# Patient Record
Sex: Male | Born: 1960 | Race: White | Hispanic: No | Marital: Married | Smoking: Never smoker
Health system: Southern US, Community
[De-identification: ages and names within clinical notes are randomized; demographics above are authoritative.]

## PROBLEM LIST (undated history)

## (undated) DIAGNOSIS — I10 Essential (primary) hypertension: Secondary | ICD-10-CM

## (undated) DIAGNOSIS — K219 Gastro-esophageal reflux disease without esophagitis: Secondary | ICD-10-CM

## (undated) DIAGNOSIS — R519 Headache, unspecified: Secondary | ICD-10-CM

## (undated) DIAGNOSIS — J302 Other seasonal allergic rhinitis: Secondary | ICD-10-CM

## (undated) DIAGNOSIS — E119 Type 2 diabetes mellitus without complications: Secondary | ICD-10-CM

## (undated) DIAGNOSIS — R51 Headache: Secondary | ICD-10-CM

## (undated) HISTORY — PX: CHOLECYSTECTOMY: SHX55

---

## 2001-11-06 ENCOUNTER — Encounter: Payer: Self-pay | Admitting: General Surgery

## 2001-11-06 ENCOUNTER — Ambulatory Visit (HOSPITAL_COMMUNITY): Admission: RE | Admit: 2001-11-06 | Discharge: 2001-11-07 | Payer: Self-pay | Admitting: General Surgery

## 2007-07-11 ENCOUNTER — Ambulatory Visit: Payer: Self-pay | Admitting: Cardiology

## 2011-12-22 ENCOUNTER — Encounter (INDEPENDENT_AMBULATORY_CARE_PROVIDER_SITE_OTHER): Payer: Self-pay | Admitting: *Deleted

## 2012-01-20 ENCOUNTER — Encounter (INDEPENDENT_AMBULATORY_CARE_PROVIDER_SITE_OTHER): Payer: Self-pay | Admitting: *Deleted

## 2012-01-20 ENCOUNTER — Other Ambulatory Visit (INDEPENDENT_AMBULATORY_CARE_PROVIDER_SITE_OTHER): Payer: Self-pay | Admitting: *Deleted

## 2012-01-20 ENCOUNTER — Telehealth (INDEPENDENT_AMBULATORY_CARE_PROVIDER_SITE_OTHER): Payer: Self-pay | Admitting: *Deleted

## 2012-01-20 DIAGNOSIS — Z1211 Encounter for screening for malignant neoplasm of colon: Secondary | ICD-10-CM

## 2012-01-20 MED ORDER — PEG-KCL-NACL-NASULF-NA ASC-C 100 G PO SOLR: 1.0000 | Freq: Once | ORAL | Status: DC

## 2012-01-20 NOTE — Telephone Encounter (Signed)
Patient needs movi prep 

## 2012-02-22 ENCOUNTER — Telehealth (INDEPENDENT_AMBULATORY_CARE_PROVIDER_SITE_OTHER): Payer: Self-pay | Admitting: *Deleted

## 2012-02-22 NOTE — Telephone Encounter (Signed)
PCP/Requesting MD: sasser  Name & DOB: Brett Ball [birth year]   Procedure: tcs  Reason/Indication:  screening  Has patient had this procedure before?  no  If so, when, by whom and where?    Is there a family history of colon cancer?  no  Who?  What age when diagnosed?    Is patient diabetic?   yes      Does patient have prosthetic heart valve?  no  Do you have a pacemaker?  no  Has patient had joint replacement within last 12 months?  no  Is patient on Coumadin, Plavix and/or Aspirin? no  Medications: metformin 500 mg daily (am), allegra 180 mg daily, metoprolol 50 mg daily, digestive enzyme prn, advil or tylenol prn  Allergies: nkda  Medication Adjustment: hold metformin morning of procecdure  Procedure date & time: 03/22/12 at 830

## 2012-02-24 NOTE — Telephone Encounter (Signed)
agree

## 2012-03-15 ENCOUNTER — Encounter (HOSPITAL_COMMUNITY): Payer: Self-pay | Admitting: Pharmacy Technician

## 2012-03-21 MED ORDER — SODIUM CHLORIDE 0.45 % IV SOLN
INTRAVENOUS | Status: DC
  Administered 2012-03-22: 08:00:00 via INTRAVENOUS

## 2012-03-22 ENCOUNTER — Encounter (HOSPITAL_COMMUNITY): Payer: Self-pay | Admitting: *Deleted

## 2012-03-22 ENCOUNTER — Encounter (HOSPITAL_COMMUNITY)
Admission: RE | Disposition: A | Discharge status: Home / Self Care | Payer: Self-pay | Source: Ambulatory Visit | Attending: Internal Medicine

## 2012-03-22 ENCOUNTER — Ambulatory Visit (HOSPITAL_COMMUNITY)
Admission: RE | Admit: 2012-03-22 | Discharge: 2012-03-22 | Disposition: A | Discharge status: Home / Self Care | Payer: BC Managed Care – PPO | Source: Ambulatory Visit | Attending: Internal Medicine | Admitting: Internal Medicine

## 2012-03-22 DIAGNOSIS — I10 Essential (primary) hypertension: Secondary | ICD-10-CM | POA: Insufficient documentation

## 2012-03-22 DIAGNOSIS — K644 Residual hemorrhoidal skin tags: Secondary | ICD-10-CM

## 2012-03-22 DIAGNOSIS — K219 Gastro-esophageal reflux disease without esophagitis: Secondary | ICD-10-CM | POA: Insufficient documentation

## 2012-03-22 DIAGNOSIS — E119 Type 2 diabetes mellitus without complications: Secondary | ICD-10-CM | POA: Insufficient documentation

## 2012-03-22 DIAGNOSIS — Z1211 Encounter for screening for malignant neoplasm of colon: Secondary | ICD-10-CM

## 2012-03-22 DIAGNOSIS — K5730 Diverticulosis of large intestine without perforation or abscess without bleeding: Secondary | ICD-10-CM | POA: Insufficient documentation

## 2012-03-22 HISTORY — DX: Gastro-esophageal reflux disease without esophagitis: K21.9

## 2012-03-22 HISTORY — DX: Type 2 diabetes mellitus without complications: E11.9

## 2012-03-22 HISTORY — PX: COLONOSCOPY: SHX5424

## 2012-03-22 HISTORY — DX: Other seasonal allergic rhinitis: J30.2

## 2012-03-22 HISTORY — DX: Essential (primary) hypertension: I10

## 2012-03-22 LAB — GLUCOSE, CAPILLARY: Glucose-Capillary: 120 mg/dL — ABNORMAL HIGH (ref 70–99)

## 2012-03-22 SURGERY — COLONOSCOPY
Anesthesia: Moderate Sedation

## 2012-03-22 MED ORDER — MEPERIDINE HCL 50 MG/ML IJ SOLN
INTRAMUSCULAR | Status: AC
  Filled 2012-03-22: qty 1

## 2012-03-22 MED ORDER — MIDAZOLAM HCL 5 MG/5ML IJ SOLN
INTRAMUSCULAR | Status: AC
  Filled 2012-03-22: qty 10

## 2012-03-22 MED ORDER — MIDAZOLAM HCL 5 MG/5ML IJ SOLN
INTRAMUSCULAR | Status: DC | PRN
  Administered 2012-03-22 (×2): 2 mg via INTRAVENOUS
  Administered 2012-03-22: 1 mg via INTRAVENOUS

## 2012-03-22 MED ORDER — STERILE WATER FOR IRRIGATION IR SOLN
Status: DC | PRN
  Administered 2012-03-22: 08:00:00

## 2012-03-22 MED ORDER — MEPERIDINE HCL 50 MG/ML IJ SOLN
INTRAMUSCULAR | Status: DC | PRN
  Administered 2012-03-22 (×2): 25 mg via INTRAVENOUS

## 2012-03-22 NOTE — H&P (Signed)
Brett Ball is an 51 y.o. male.   Chief Complaint: Patient is here for colonoscopy. HPI: Patient-year-old Caucasian male who is undergoing screening colonoscopy. He denies pain change in his rectal bleeding. He has occasional hematochezia. Family history is negative for colorectal carcinoma.  Past Medical History  Diagnosis Date  . Hypertension   . Seasonal allergies   . Diabetes mellitus without complication   . GERD (gastroesophageal reflux disease)     occasionally    Past Surgical History  Procedure Date  . Cholecystectomy     Family History  Problem Relation Age of Onset  . Colon cancer Neg Hx    Social History:  reports that he has never smoked. He does not have any smokeless tobacco history on file. He reports that he drinks alcohol. He reports that he does not use illicit drugs.  Allergies: No Known Allergies  Medications Prior to Admission  Medication Sig Dispense Refill  . fexofenadine (ALLEGRA) 180 MG tablet Take 180 mg by mouth daily.      Marland Kitchen ibuprofen (ADVIL,MOTRIN) 200 MG tablet Take 200 mg by mouth every 6 (six) hours as needed. Pain      . metFORMIN (GLUCOPHAGE) 500 MG tablet Take 500 mg by mouth 2 (two) times daily with a meal.      . metoprolol succinate (TOPROL-XL) 50 MG 24 hr tablet Take 50 mg by mouth daily. Take with or immediately following a meal.      . peg 3350 powder (MOVIPREP) 100 G SOLR Take 1 kit (100 g total) by mouth once.  1 kit  0    No results found for this or any previous visit (from the past 48 hour(s)). No results found.  ROS  Blood pressure 134/96, pulse 77, temperature 97.8 F (36.6 C), temperature source Oral, resp. rate 16, height 5\' 9"  (1.753 m), weight 222 lb (100.699 kg), SpO2 100.00%. Physical Exam  Constitutional: He appears well-developed and well-nourished.  HENT:  Mouth/Throat: Oropharynx is clear and moist.  Eyes: Conjunctivae normal are normal. No scleral icterus.  Neck: No thyromegaly present.  Cardiovascular:  Normal rate, regular rhythm and normal heart sounds.   No murmur heard. Respiratory: Effort normal and breath sounds normal.  GI: Soft. He exhibits no distension and no mass. There is no tenderness.  Musculoskeletal: He exhibits no edema.  Lymphadenopathy:    He has no cervical adenopathy.  Neurological: He is alert.  Skin: Skin is warm and dry.     Assessment/Plan Average risk screening colonoscopy.  REHMAN,NAJEEB U 03/22/2012, 8:30 AM

## 2012-03-22 NOTE — Op Note (Signed)
COLONOSCOPY PROCEDURE REPORT  PATIENT:  Brett Ball  MR#:  [id number] Birthdate:  12-27-1960, 51 y.o., male Endoscopist:  Dr. Malissa Hippo, MD Referred By:  Dr. Estanislado Pandy, MD Procedure Date: 03/22/2012  Procedure:   Colonoscopy  Indications: Patient is 51 year old Caucasian male who is undergoing average risk screening colonoscopy.  Informed Consent:  The procedure and risks were reviewed with the patient and informed consent was obtained.  Medications:  Demerol 50 mg IV Versed 5 mg IV  Description of procedure:  After a digital rectal exam was performed, that colonoscope was advanced from the anus through the rectum and colon to the area of the cecum, ileocecal valve and appendiceal orifice. The cecum was deeply intubated. These structures were well-seen and photographed for the record. From the level of the cecum and ileocecal valve, the scope was slowly and cautiously withdrawn. The mucosal surfaces were carefully surveyed utilizing scope tip to flexion to facilitate fold flattening as needed. The scope was pulled down into the rectum where a thorough exam including retroflexion was performed.  Findings:   Prep excellent. Few scattered diverticula at sigmoid colon. Normal rectal mucosa. And moist below the dentate line. Single anal kin tag posteriorly.  Therapeutic/Diagnostic Maneuvers Performed:  None  Complications:  None  Cecal Withdrawal Time:  9 minutes  Impression:  Examination performed to cecum. Few scattered diverticula at sigmoid colon. External hemorrhoids and single anal skin tag.  Recommendations:  Standard instructions given. High fiber diet. Next screening exam in 10 years.  REHMAN,NAJEEB U  03/22/2012 8:59 AM  CC: Dr. Estanislado Pandy, MD & Dr. Bonnetta Barry ref. provider found

## 2012-03-24 ENCOUNTER — Encounter (HOSPITAL_COMMUNITY): Payer: Self-pay | Admitting: Internal Medicine

## 2015-08-18 ENCOUNTER — Other Ambulatory Visit (HOSPITAL_COMMUNITY): Payer: Self-pay | Admitting: Family Medicine

## 2015-08-18 DIAGNOSIS — R52 Pain, unspecified: Secondary | ICD-10-CM

## 2015-08-21 ENCOUNTER — Ambulatory Visit (HOSPITAL_COMMUNITY)
Admission: RE | Admit: 2015-08-21 | Discharge: 2015-08-21 | Disposition: A | Discharge status: Home / Self Care | Payer: BC Managed Care – PPO | Source: Ambulatory Visit | Attending: Family Medicine | Admitting: Family Medicine

## 2015-08-21 DIAGNOSIS — R52 Pain, unspecified: Secondary | ICD-10-CM

## 2015-08-21 DIAGNOSIS — N2881 Hypertrophy of kidney: Secondary | ICD-10-CM | POA: Diagnosis not present

## 2015-08-21 DIAGNOSIS — R109 Unspecified abdominal pain: Secondary | ICD-10-CM | POA: Diagnosis present

## 2015-12-11 DIAGNOSIS — J343 Hypertrophy of nasal turbinates: Secondary | ICD-10-CM | POA: Insufficient documentation

## 2015-12-11 DIAGNOSIS — J342 Deviated nasal septum: Secondary | ICD-10-CM | POA: Insufficient documentation

## 2016-01-12 ENCOUNTER — Other Ambulatory Visit: Payer: Self-pay | Admitting: Otolaryngology

## 2016-01-12 ENCOUNTER — Encounter (HOSPITAL_COMMUNITY): Payer: Self-pay

## 2016-01-12 ENCOUNTER — Other Ambulatory Visit: Payer: Self-pay

## 2016-01-12 ENCOUNTER — Encounter (HOSPITAL_COMMUNITY)
Admission: RE | Admit: 2016-01-12 | Discharge: 2016-01-12 | Disposition: A | Discharge status: Home / Self Care | Payer: BC Managed Care – PPO | Source: Ambulatory Visit | Attending: Otolaryngology | Admitting: Otolaryngology

## 2016-01-12 DIAGNOSIS — Z01812 Encounter for preprocedural laboratory examination: Secondary | ICD-10-CM | POA: Insufficient documentation

## 2016-01-12 HISTORY — DX: Headache: R51

## 2016-01-12 HISTORY — DX: Headache, unspecified: R51.9

## 2016-01-12 LAB — BASIC METABOLIC PANEL
ANION GAP: 6 (ref 5–15)
BUN: 12 mg/dL (ref 6–20)
CALCIUM: 9.1 mg/dL (ref 8.9–10.3)
CHLORIDE: 107 mmol/L (ref 101–111)
CO2: 26 mmol/L (ref 22–32)
Creatinine, Ser: 1.15 mg/dL (ref 0.61–1.24)
GFR calc non Af Amer: >60 mL/min (ref >60)
GLUCOSE: 144 mg/dL — AB (ref 65–99)
Potassium: 4.2 mmol/L (ref 3.5–5.1)
Sodium: 139 mmol/L (ref 135–145)

## 2016-01-12 LAB — CBC
HCT: 43.9 % (ref 39.0–52.0)
HEMOGLOBIN: 14.6 g/dL (ref 13.0–17.0)
MCH: 28.8 pg (ref 26.0–34.0)
MCHC: 33.3 g/dL (ref 30.0–36.0)
MCV: 86.6 fL (ref 78.0–100.0)
Platelets: 253 10*3/uL (ref 150–400)
RBC: 5.07 MIL/uL (ref 4.22–5.81)
RDW: 13.2 % (ref 11.5–15.5)
WBC: 8.7 10*3/uL (ref 4.0–10.5)

## 2016-01-12 LAB — GLUCOSE, CAPILLARY: Glucose-Capillary: 112 mg/dL — ABNORMAL HIGH (ref 65–99)

## 2016-01-12 NOTE — Pre-Procedure Instructions (Signed)
Brett Ball  01/12/2016      [facility] [place] - [address] [place] [id number] Phone: [phone] Fax: [phone]    Your procedure is scheduled on Wednesday September 20.  [facility] to [facility] Ad[facility] at 6:30 A.M.  Call this number if you have problems the morning of surgery:  [phone]   Remember:  Do not eat food or drink liquids after midnight.  Take these medicines the morning of surgery with A SIP OF WATER: metoprolol (Toprol-XL), Allegra, nasonex spray  7 days prior to surgery STOP taking any Aspirin, Aleve, Naproxen, Ibuprofen, Motrin, Advil, Goody's, BC's, all herbal medications, fish oil, and all vitamins   WHAT DO I DO ABOUT MY DIABETES MEDICATION?   Marland Kitchen. Do not take oral diabetes medicines (pills) the morning of surgery. DO NOT TAKE Metformin (glucophage) the day of surgery   . If your CBG is greater than 220 mg/dL, you may take  of your sliding scale (correction) dose of insulin.    How to Manage Your Diabetes Before and After Surgery  Why is it important to control my blood sugar before and after surgery? . Improving blood sugar levels before and after surgery helps healing and can limit problems. . A way of improving blood sugar control is eating a healthy diet by: o  Eating less sugar and carbohydrates o  Increasing activity/exercise o  Talking with your doctor about reaching your blood sugar goals . High blood sugars (greater than 180 mg/dL) can raise your risk of infections and slow your recovery, so you will need to focus on controlling your diabetes during the weeks before surgery. . Make sure that the doctor who takes care of your diabetes knows about your planned surgery including the date and location.  How do I manage my blood sugar before surgery? . Check your blood sugar at least 4 times a day, starting 2 days before surgery, to make sure that the level is not too high or  low. o Check your blood sugar the morning of your surgery when you wake up and every 2 hours until you get to the Short Stay unit. . If your blood sugar is less than 70 mg/dL, you will need to treat for low blood sugar: o Do not take insulin. o Treat a low blood sugar (less than 70 mg/dL) with  cup of clear juice (cranberry or apple), 4 glucose tablets, OR glucose gel. o Recheck blood sugar in 15 minutes after treatment (to make sure it is greater than 70 mg/dL). If your blood sugar is not greater than 70 mg/dL on recheck, call [phone] for further instructions. . Report your blood sugar to the short stay nurse when you get to Short Stay.  . If you are admitted to the hospital after surgery: o Your blood sugar will be checked by the staff and you will probably be given insulin after surgery (instead of oral diabetes medicines) to make sure you have good blood sugar levels. o The goal for blood sugar control after surgery is 80-180 mg/dL.              Other Instructions:          Patient Signature:  Date:   Nurse Signature:  Date:   Reviewed and Endorsed by [facility] Patient Education Committee, August 2015  Do not wear jewelry, make-up or nail polish.  Do not wear lotions, powders, or colognes, or  deoderant.  Men may shave face and neck.  Do not bring valuables to the hospital.  [facility] is not responsible for any belongings or valuables.  Contacts, dentures or bridgework may not be worn into surgery.  Leave your suitcase in the car.  After surgery it may be brought to your room.  For patients admitted to the hospital, discharge time will be determined by your treatment team.  Patients discharged the day of surgery will not be allowed to drive home.    Special instructions:    [facility]- Preparing For Surgery  Before surgery, you can play an important role. Because skin is not sterile, your skin needs to be as free of germs as possible. You can reduce  the number of germs on your skin by washing with CHG (chlorahexidine gluconate) Soap before surgery.  CHG is an antiseptic cleaner which kills germs and bonds with the skin to continue killing germs even after washing.  Please do not use if you have an allergy to CHG or antibacterial soaps. If your skin becomes reddened/irritated stop using the CHG.  Do not shave (including legs and underarms) for at least 48 hours prior to first CHG shower. It is OK to shave your face.  Please follow these instructions carefully.   1. Shower the NIGHT BEFORE SURGERY and the MORNING OF SURGERY with CHG.   2. If you chose to wash your hair, wash your hair first as usual with your normal shampoo.  3. After you shampoo, rinse your hair and body thoroughly to remove the shampoo.  4. Use CHG as you would any other liquid soap. You can apply CHG directly to the skin and wash gently with a scrungie or a clean washcloth.   5. Apply the CHG Soap to your body ONLY FROM THE NECK DOWN.  Do not use on open wounds or open sores. Avoid contact with your eyes, ears, mouth and genitals (private parts). Wash genitals (private parts) with your normal soap.  6. Wash thoroughly, paying special attention to the area where your surgery will be performed.  7. Thoroughly rinse your body with warm water from the neck down.  8. DO NOT shower/wash with your normal soap after using and rinsing off the CHG Soap.  9. Pat yourself dry with a CLEAN TOWEL.   10. Wear CLEAN PAJAMAS   11. Place CLEAN SHEETS on your bed the night of your first shower and DO NOT SLEEP WITH PETS.    Day of Surgery: Do not apply any deodorants/lotions. Please wear clean clothes to the hospital/surgery center.      Please read over the following fact sheets that you were given. Surgical Site Infection Prevention

## 2016-01-12 NOTE — Progress Notes (Signed)
PCP is Dr. Fara ChutePaul Sasser Denies ever seeing a cardiologist. Denies Having a card cath, stress test, or echo. Denies having chest pain.

## 2016-01-12 NOTE — Progress Notes (Signed)
Reports his fasting cbg's run 117-120

## 2016-01-12 NOTE — Progress Notes (Signed)
   01/12/16 1453  OBSTRUCTIVE SLEEP APNEA  Score 5 or greater  Results sent to PCP

## 2016-01-12 NOTE — H&P (Signed)
Otolaryngology Clinic Note  HPI:    Brett Ball is a 55 y.o. male patient of Estanislado PandyPAUL W SASSER, MD for preop evaluation.  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  We are preparing for septoplasty and reduction of turbinates under anesthesia next week.  Given an assumption of obstructive sleep apnea, we will observe him 23 hours at [facility].    He has had a recent bout of diverticulitis and is just completing a course of Flagyl and Cipro.  He had a single episode of lancinating headache from the occiput to the right orbit.  He is to discuss with his family physician about a possible MRA scan.  I discussed the surgery in detail including risks and complications.  Questions were answered and informed consent was obtained. PMH/Meds/All/SocHx/FamHx/ROS:   History reviewed. No pertinent past medical history.  Past Surgical History:  Procedure Laterality Date  . GALLBLADDER SURGERY    . WISDOM TOOTH EXTRACTION      No family history of bleeding disorders, wound healing problems or difficulty with anesthesia.   Social History   Social History  . Marital status: Married    Spouse name: N/A  . Number of children: N/A  . Years of education: N/A   Occupational History  . Not on file.   Social History Main Topics  . Smoking status: Never Smoker  . Smokeless tobacco: Not on file  . Alcohol use Not on file  . Drug use: Not on file  . Sexual activity: Not on file   Other Topics Concern  . Not on file   Social History Narrative     Current Outpatient Prescriptions:  .  cephalexin 500 mg tablet, Take 500 mg by mouth 4 times daily for 10 days., Disp: 40 tablet, Rfl: 0 .  fexofenadine (ALLEGRA) 180 MG tablet, Take by mouth., Disp: , Rfl:  .  HYDROcodone-acetaminophen (NORCO) 5-325 mg per tablet, Take 1-2 tablets by mouth every 4 (four) hours as needed for Pain., Disp: 30 tablet, Rfl: 0 .  metFORMIN (GLUCOPHAGE) 500 MG tablet, Take by mouth.,  Disp: , Rfl:  .  metoPROLOL succinate (TOPROL-XL) 50 MG 24 hr tablet, Take by mouth., Disp: , Rfl:   A complete ROS was performed with pertinent positives/negatives noted in the HPI. The remainder of the ROS are negative.    Physical Exam:    There were no vitals taken for this visit. He is stocky and healthy.  Mental status is appropriate.  He appears well in conversational speech.  Voice is clear and respirations unlabored through the nose and mouth.  The head is atraumatic and neck supple.  Cranial nerves intact.  Ear canals are clear with normal drums.  Anterior nose shows a severe buccal and corrugated septum with presentation of the caudal edge into the right nasal vestibule.  Oral cavity is clear with teeth in good repair.  Oropharynx is slightly thick uvula and soft palate.  Neck unremarkable. Lungs: Clear to auscultation Heart: Regular rate and rhythm without murmurs Abdomen: Soft, active Extremities: Normal configuration Neurologic: Symmetric, grossly intact.       Impression & Plans:   Severe nasal septal deviation with obstruction.  Hypertrophic inferior turbinates.  Possible obstructive sleep apnea.  Plan: We are preparing for septoplasty and SMR inferior turbinates.  He may need an MRA scan first.  I am giving him prescriptions for hydrocodone and for cephalexin.  I will remove his nasal packing before he leaves the hospital, the  septal splints in 10 days.  I have given him nasal hygiene instructions today.  Fernande Boyden, MD  01/12/2016

## 2016-01-13 LAB — HEMOGLOBIN A1C
Hgb A1c MFr Bld: 6.2 % — ABNORMAL HIGH (ref 4.8–5.6)
Mean Plasma Glucose: 131 mg/dL

## 2016-01-20 MED ORDER — DEXTROSE 5 % IV SOLN
3.0000 g | INTRAVENOUS | Status: AC
  Administered 2016-01-21: 3 g via INTRAVENOUS
  Filled 2016-01-20: qty 3000

## 2016-01-20 NOTE — Anesthesia Preprocedure Evaluation (Addendum)
Anesthesia Evaluation  Patient identified by MRN, date of birth, ID band Patient awake    Reviewed: Allergy & Precautions, NPO status , Patient's Chart, lab work & pertinent test results, reviewed documented beta blocker date and time   History of Anesthesia Complications Negative for: history of anesthetic complications  Airway Mallampati: II  TM Distance: >3 FB Neck ROM: Full    Dental  (+) Teeth Intact, Dental Advisory Given   Pulmonary neg pulmonary ROS,    Pulmonary exam normal breath sounds clear to auscultation       Cardiovascular hypertension, Pt. on home beta blockers (-) angina(-) CAD, (-) Past MI and (-) CHF Normal cardiovascular exam Rhythm:Regular Rate:Normal     Neuro/Psych  Headaches, negative psych ROS   GI/Hepatic Neg liver ROS, GERD  ,  Endo/Other  diabetes, Type 2, Oral Hypoglycemic AgentsObesity   Renal/GU negative Renal ROS     Musculoskeletal negative musculoskeletal ROS (+)   Abdominal   Peds  Hematology negative hematology ROS (+)   Anesthesia Other Findings Day of surgery medications reviewed with the patient.  Reproductive/Obstetrics                            Anesthesia Physical Anesthesia Plan  ASA: II  Anesthesia Plan: General   Post-op Pain Management:    Induction: Intravenous  Airway Management Planned: Oral ETT  Additional Equipment:   Intra-op Plan:   Post-operative Plan: Extubation in OR  Informed Consent: I have reviewed the patients History and Physical, chart, labs and discussed the procedure including the risks, benefits and alternatives for the proposed anesthesia with the patient or authorized representative who has indicated his/her understanding and acceptance.   Dental advisory given  Plan Discussed with: CRNA  Anesthesia Plan Comments: (Risks/benefits of general anesthesia discussed with patient including risk of damage to  teeth, lips, gum, and tongue, nausea/vomiting, allergic reactions to medications, and the possibility of heart attack, stroke and death.  All patient questions answered.  Patient wishes to proceed.)        Anesthesia Quick Evaluation

## 2016-01-21 ENCOUNTER — Ambulatory Visit (HOSPITAL_COMMUNITY): Payer: BC Managed Care – PPO | Admitting: Anesthesiology

## 2016-01-21 ENCOUNTER — Encounter (HOSPITAL_COMMUNITY)
Admission: RE | Disposition: A | Discharge status: Home / Self Care | Payer: Self-pay | Source: Ambulatory Visit | Attending: Otolaryngology

## 2016-01-21 ENCOUNTER — Ambulatory Visit (HOSPITAL_COMMUNITY)
Admission: RE | Admit: 2016-01-21 | Discharge: 2016-01-22 | Disposition: A | Discharge status: Home / Self Care | Payer: BC Managed Care – PPO | Source: Ambulatory Visit | Attending: Otolaryngology | Admitting: Otolaryngology

## 2016-01-21 ENCOUNTER — Encounter (HOSPITAL_COMMUNITY): Payer: Self-pay | Admitting: Urology

## 2016-01-21 DIAGNOSIS — R51 Headache: Secondary | ICD-10-CM | POA: Insufficient documentation

## 2016-01-21 DIAGNOSIS — J343 Hypertrophy of nasal turbinates: Secondary | ICD-10-CM | POA: Diagnosis not present

## 2016-01-21 DIAGNOSIS — Z6831 Body mass index (BMI) 31.0-31.9, adult: Secondary | ICD-10-CM | POA: Diagnosis not present

## 2016-01-21 DIAGNOSIS — I10 Essential (primary) hypertension: Secondary | ICD-10-CM | POA: Insufficient documentation

## 2016-01-21 DIAGNOSIS — E119 Type 2 diabetes mellitus without complications: Secondary | ICD-10-CM | POA: Insufficient documentation

## 2016-01-21 DIAGNOSIS — E669 Obesity, unspecified: Secondary | ICD-10-CM | POA: Insufficient documentation

## 2016-01-21 DIAGNOSIS — K219 Gastro-esophageal reflux disease without esophagitis: Secondary | ICD-10-CM | POA: Insufficient documentation

## 2016-01-21 DIAGNOSIS — G4733 Obstructive sleep apnea (adult) (pediatric): Secondary | ICD-10-CM | POA: Diagnosis not present

## 2016-01-21 DIAGNOSIS — Z7984 Long term (current) use of oral hypoglycemic drugs: Secondary | ICD-10-CM | POA: Insufficient documentation

## 2016-01-21 DIAGNOSIS — J342 Deviated nasal septum: Secondary | ICD-10-CM | POA: Insufficient documentation

## 2016-01-21 HISTORY — PX: NASAL SEPTOPLASTY W/ TURBINOPLASTY: SHX2070

## 2016-01-21 LAB — GLUCOSE, CAPILLARY
GLUCOSE-CAPILLARY: 142 mg/dL — AB (ref 65–99)
Glucose-Capillary: 114 mg/dL — ABNORMAL HIGH (ref 65–99)

## 2016-01-21 SURGERY — SEPTOPLASTY, NOSE, WITH NASAL TURBINATE REDUCTION
Anesthesia: General | Site: Nose | Laterality: Bilateral

## 2016-01-21 MED ORDER — ONDANSETRON HCL 4 MG/2ML IJ SOLN
INTRAMUSCULAR | Status: DC | PRN
  Administered 2016-01-21: 4 mg via INTRAVENOUS

## 2016-01-21 MED ORDER — LABETALOL HCL 5 MG/ML IV SOLN
10.0000 mg | INTRAVENOUS | Status: AC | PRN
  Administered 2016-01-21 (×2): 10 mg via INTRAVENOUS

## 2016-01-21 MED ORDER — LIDOCAINE-EPINEPHRINE 1 %-1:100000 IJ SOLN
INTRAMUSCULAR | Status: DC | PRN
  Administered 2016-01-21: 16 mL

## 2016-01-21 MED ORDER — ROCURONIUM BROMIDE 10 MG/ML (PF) SYRINGE
PREFILLED_SYRINGE | INTRAVENOUS | Status: AC
  Filled 2016-01-21: qty 10

## 2016-01-21 MED ORDER — FENTANYL CITRATE (PF) 100 MCG/2ML IJ SOLN
INTRAMUSCULAR | Status: DC | PRN
  Administered 2016-01-21 (×2): 50 ug via INTRAVENOUS

## 2016-01-21 MED ORDER — OXYMETAZOLINE HCL 0.05 % NA SOLN
NASAL | Status: AC
  Filled 2016-01-21: qty 15

## 2016-01-21 MED ORDER — OXYMETAZOLINE HCL 0.05 % NA SOLN
2.0000 | Freq: Two times a day (BID) | NASAL | Status: DC | PRN
  Administered 2016-01-21: 2 via NASAL

## 2016-01-21 MED ORDER — DEXTROSE-NACL 5-0.45 % IV SOLN
INTRAVENOUS | Status: DC
  Administered 2016-01-21: 14:00:00 via INTRAVENOUS

## 2016-01-21 MED ORDER — PROMETHAZINE HCL 25 MG/ML IJ SOLN: 6.2500 mg | INTRAMUSCULAR | Status: DC | PRN

## 2016-01-21 MED ORDER — SUGAMMADEX SODIUM 200 MG/2ML IV SOLN
INTRAVENOUS | Status: AC
  Filled 2016-01-21: qty 2

## 2016-01-21 MED ORDER — MIDAZOLAM HCL 2 MG/2ML IJ SOLN
INTRAMUSCULAR | Status: AC
  Filled 2016-01-21: qty 2

## 2016-01-21 MED ORDER — LIDOCAINE 2% (20 MG/ML) 5 ML SYRINGE
INTRAMUSCULAR | Status: AC
  Filled 2016-01-21: qty 5

## 2016-01-21 MED ORDER — FENTANYL CITRATE (PF) 100 MCG/2ML IJ SOLN
INTRAMUSCULAR | Status: AC
  Filled 2016-01-21: qty 2

## 2016-01-21 MED ORDER — ONDANSETRON HCL 4 MG/2ML IJ SOLN
INTRAMUSCULAR | Status: AC
  Filled 2016-01-21: qty 2

## 2016-01-21 MED ORDER — 0.9 % SODIUM CHLORIDE (POUR BTL) OPTIME
TOPICAL | Status: DC | PRN
  Administered 2016-01-21: 1000 mL

## 2016-01-21 MED ORDER — METOPROLOL SUCCINATE ER 50 MG PO TB24
50.0000 mg | ORAL_TABLET | Freq: Every day | ORAL | Status: DC
  Administered 2016-01-21 – 2016-01-22 (×2): 50 mg via ORAL
  Filled 2016-01-21 (×2): qty 1

## 2016-01-21 MED ORDER — PHENYLEPHRINE HCL 10 MG/ML IJ SOLN
INTRAMUSCULAR | Status: DC | PRN
  Administered 2016-01-21 (×3): 80 ug via INTRAVENOUS

## 2016-01-21 MED ORDER — HYDROCODONE-ACETAMINOPHEN 5-325 MG PO TABS
1.0000 | ORAL_TABLET | ORAL | Status: DC | PRN
  Administered 2016-01-21: 2 via ORAL

## 2016-01-21 MED ORDER — LACTATED RINGERS IV SOLN
INTRAVENOUS | Status: DC | PRN
  Administered 2016-01-21 (×2): via INTRAVENOUS

## 2016-01-21 MED ORDER — EPHEDRINE 5 MG/ML INJ
INTRAVENOUS | Status: AC
  Filled 2016-01-21: qty 10

## 2016-01-21 MED ORDER — MIDAZOLAM HCL 5 MG/5ML IJ SOLN
INTRAMUSCULAR | Status: DC | PRN
  Administered 2016-01-21: 2 mg via INTRAVENOUS

## 2016-01-21 MED ORDER — HYDROCODONE-ACETAMINOPHEN 5-325 MG PO TABS
2.0000 | ORAL_TABLET | Freq: Once | ORAL | Status: AC
Start: 2016-01-22 — End: 2016-01-22
  Administered 2016-01-22: 2 via ORAL
  Filled 2016-01-21: qty 2

## 2016-01-21 MED ORDER — PROPOFOL 10 MG/ML IV BOLUS
INTRAVENOUS | Status: AC
  Filled 2016-01-21: qty 20

## 2016-01-21 MED ORDER — FENTANYL CITRATE (PF) 100 MCG/2ML IJ SOLN
INTRAMUSCULAR | Status: AC
  Administered 2016-01-21: 50 ug via INTRAVENOUS
  Filled 2016-01-21: qty 2

## 2016-01-21 MED ORDER — ROCURONIUM BROMIDE 10 MG/ML (PF) SYRINGE
PREFILLED_SYRINGE | INTRAVENOUS | Status: DC | PRN
  Administered 2016-01-21: 50 mg via INTRAVENOUS

## 2016-01-21 MED ORDER — LABETALOL HCL 5 MG/ML IV SOLN
INTRAVENOUS | Status: AC
  Administered 2016-01-21: 10 mg via INTRAVENOUS
  Filled 2016-01-21: qty 4

## 2016-01-21 MED ORDER — METFORMIN HCL ER 500 MG PO TB24
500.0000 mg | ORAL_TABLET | Freq: Every day | ORAL | Status: DC
  Administered 2016-01-22: 500 mg via ORAL
  Filled 2016-01-21: qty 1

## 2016-01-21 MED ORDER — BACITRACIN ZINC 500 UNIT/GM EX OINT
TOPICAL_OINTMENT | CUTANEOUS | Status: AC
  Filled 2016-01-21: qty 28.35

## 2016-01-21 MED ORDER — ONDANSETRON HCL 4 MG/2ML IJ SOLN: 4.0000 mg | INTRAMUSCULAR | Status: DC | PRN

## 2016-01-21 MED ORDER — EPHEDRINE SULFATE-NACL 50-0.9 MG/10ML-% IV SOSY
PREFILLED_SYRINGE | INTRAVENOUS | Status: DC | PRN
  Administered 2016-01-21: 7.5 mg via INTRAVENOUS

## 2016-01-21 MED ORDER — OXYMETAZOLINE HCL 0.05 % NA SOLN
NASAL | Status: DC | PRN
  Administered 2016-01-21: 1

## 2016-01-21 MED ORDER — PNEUMOCOCCAL VAC POLYVALENT 25 MCG/0.5ML IJ INJ
0.5000 mL | INJECTION | INTRAMUSCULAR | Status: DC
  Filled 2016-01-21: qty 0.5

## 2016-01-21 MED ORDER — FENTANYL CITRATE (PF) 100 MCG/2ML IJ SOLN
25.0000 ug | INTRAMUSCULAR | Status: DC | PRN
  Administered 2016-01-21 (×2): 50 ug via INTRAVENOUS

## 2016-01-21 MED ORDER — SUGAMMADEX SODIUM 200 MG/2ML IV SOLN
INTRAVENOUS | Status: DC | PRN
  Administered 2016-01-21: 200 mg via INTRAVENOUS

## 2016-01-21 MED ORDER — HYDROCODONE-ACETAMINOPHEN 5-325 MG PO TABS
ORAL_TABLET | ORAL | Status: AC
  Administered 2016-01-21: 2 via ORAL
  Filled 2016-01-21: qty 2

## 2016-01-21 MED ORDER — LIDOCAINE-EPINEPHRINE 1 %-1:100000 IJ SOLN
INTRAMUSCULAR | Status: AC
  Filled 2016-01-21: qty 1

## 2016-01-21 MED ORDER — LIDOCAINE 2% (20 MG/ML) 5 ML SYRINGE
INTRAMUSCULAR | Status: DC | PRN
  Administered 2016-01-21: 100 mg via INTRAVENOUS

## 2016-01-21 MED ORDER — PROPOFOL 10 MG/ML IV BOLUS
INTRAVENOUS | Status: DC | PRN
  Administered 2016-01-21: 150 mg via INTRAVENOUS

## 2016-01-21 MED ORDER — BACITRACIN ZINC 500 UNIT/GM EX OINT
TOPICAL_OINTMENT | CUTANEOUS | Status: DC | PRN
  Administered 2016-01-21: 1 via TOPICAL

## 2016-01-21 MED ORDER — CEPHALEXIN 500 MG PO CAPS
500.0000 mg | ORAL_CAPSULE | Freq: Four times a day (QID) | ORAL | Status: DC
  Administered 2016-01-21 – 2016-01-22 (×4): 500 mg via ORAL
  Filled 2016-01-21 (×4): qty 1

## 2016-01-21 MED ORDER — ONDANSETRON HCL 4 MG PO TABS: 4.0000 mg | ORAL_TABLET | ORAL | Status: DC | PRN

## 2016-01-21 MED ORDER — IBUPROFEN 100 MG/5ML PO SUSP
400.0000 mg | Freq: Four times a day (QID) | ORAL | Status: DC | PRN
  Administered 2016-01-21 – 2016-01-22 (×3): 400 mg via ORAL
  Filled 2016-01-21 (×6): qty 20

## 2016-01-21 SURGICAL SUPPLY — 44 items
ATTRACTOMAT 16X20 MAGNETIC DRP (DRAPES) ×3 IMPLANT
BLADE SURG 15 STRL LF DISP TIS (BLADE) IMPLANT
BLADE SURG 15 STRL SS (BLADE)
CANISTER SUCTION 2500CC (MISCELLANEOUS) ×3 IMPLANT
COAGULATOR SUCT 6 FR SWTCH (ELECTROSURGICAL) ×1
COAGULATOR SUCT SWTCH 10FR 6 (ELECTROSURGICAL) ×2 IMPLANT
CRADLE DONUT ADULT HEAD (MISCELLANEOUS) IMPLANT
DRAPE PROXIMA HALF (DRAPES) ×3 IMPLANT
DRSG NASOPORE 8CM (GAUZE/BANDAGES/DRESSINGS) IMPLANT
DRSG TELFA 3X8 NADH (GAUZE/BANDAGES/DRESSINGS) ×3 IMPLANT
ELECT REM PT RETURN 9FT ADLT (ELECTROSURGICAL) ×3
ELECTRODE REM PT RTRN 9FT ADLT (ELECTROSURGICAL) ×1 IMPLANT
GAUZE PACKING FOLDED 2  STR (GAUZE/BANDAGES/DRESSINGS) ×2
GAUZE PACKING FOLDED 2 STR (GAUZE/BANDAGES/DRESSINGS) ×1 IMPLANT
GAUZE SPONGE 2X2 8PLY STRL LF (GAUZE/BANDAGES/DRESSINGS) IMPLANT
GAUZE SPONGE 4X4 12PLY STRL (GAUZE/BANDAGES/DRESSINGS) ×3 IMPLANT
GEL ULTRASOUND 20GR AQUASONIC (MISCELLANEOUS) ×3 IMPLANT
GLOVE BIO SURGEON STRL SZ7 (GLOVE) ×3 IMPLANT
GLOVE BIOGEL PI IND STRL 7.0 (GLOVE) ×1 IMPLANT
GLOVE BIOGEL PI INDICATOR 7.0 (GLOVE) ×2
GLOVE ECLIPSE 8.0 STRL XLNG CF (GLOVE) ×3 IMPLANT
GLOVE SURG SS PI 7.0 STRL IVOR (GLOVE) ×3 IMPLANT
GOWN STRL REUS W/ TWL LRG LVL3 (GOWN DISPOSABLE) ×1 IMPLANT
GOWN STRL REUS W/ TWL XL LVL3 (GOWN DISPOSABLE) ×1 IMPLANT
GOWN STRL REUS W/TWL LRG LVL3 (GOWN DISPOSABLE) ×2
GOWN STRL REUS W/TWL XL LVL3 (GOWN DISPOSABLE) ×2
KIT BASIN OR (CUSTOM PROCEDURE TRAY) ×3 IMPLANT
KIT ROOM TURNOVER OR (KITS) ×3 IMPLANT
NEEDLE HYPO 25GX1X1/2 BEV (NEEDLE) ×3 IMPLANT
NEEDLE SPNL 25GX3.5 QUINCKE BL (NEEDLE) ×3 IMPLANT
NS IRRIG 1000ML POUR BTL (IV SOLUTION) ×3 IMPLANT
PAD ARMBOARD 7.5X6 YLW CONV (MISCELLANEOUS) ×6 IMPLANT
PATTIES SURGICAL .5 X3 (DISPOSABLE) ×3 IMPLANT
SHEET SIL 040 (INSTRUMENTS) ×3 IMPLANT
SOL PREP POV-IOD 4OZ 10% (MISCELLANEOUS) ×3 IMPLANT
SPECIMEN JAR SMALL (MISCELLANEOUS) IMPLANT
SPONGE GAUZE 2X2 STER 10/PKG (GAUZE/BANDAGES/DRESSINGS)
SUT CHROMIC 4 0 P 3 18 (SUTURE) ×3 IMPLANT
SUT ETHILON 3 0 PS 1 (SUTURE) ×3 IMPLANT
SUT PDS AB 4-0 P3 18 (SUTURE) ×3 IMPLANT
SUT PDS AB 4-0 SH 27 (SUTURE) ×3 IMPLANT
SUT PLAIN 4 0 ~~LOC~~ 1 (SUTURE) IMPLANT
TRAY ENT MC OR (CUSTOM PROCEDURE TRAY) ×3 IMPLANT
WATER STERILE IRR 1000ML POUR (IV SOLUTION) ×3 IMPLANT

## 2016-01-21 NOTE — Anesthesia Procedure Notes (Signed)
Procedure Name: Intubation Date/Time: 01/21/2016 8:45 AM Performed by: Sharlene DoryWALKER, Brett Haden E Pre-anesthesia Checklist: Patient identified, Emergency Drugs available, Suction available and Patient being monitored Patient Re-evaluated:Patient Re-evaluated prior to inductionOxygen Delivery Method: Circle system utilized Preoxygenation: Pre-oxygenation with 100% oxygen Intubation Type: IV induction Ventilation: Mask ventilation without difficulty Laryngoscope Size: Mac and 4 Grade View: Grade III Tube type: Oral Tube size: 7.5 mm Number of attempts: 1 Airway Equipment and Method: Stylet Placement Confirmation: positive ETCO2 and breath sounds checked- equal and bilateral Secured at: 22 cm Dental Injury: Teeth and Oropharynx as per pre-operative assessment

## 2016-01-21 NOTE — Discharge Instructions (Signed)
Drip pad as needed Keep head elevated 3-4 nights No nose blowing No strenuous activity x 2 weeks OK to rinse throat with cool dilute salt water as desired to clear old blood and dry phlegm See nasal hygiene instructions from my office.  May begin tomorrow after I remove the packs OK to shower OK for routine activities right away tomorrow. Call for bleeding Recheck my office 9 days for septal splint removal

## 2016-01-21 NOTE — Interval H&P Note (Signed)
History and Physical Interval Note:  01/21/2016 8:34 AM  Brett Ball  has presented today for surgery, with the diagnosis of severe deviated nasal septum  hypertrophic turbinate  The various methods of treatment have been discussed with the patient and family. After consideration of risks, benefits and other options for treatment, the patient has consented to  Procedure(s): NASAL SEPTOPLASTY WITH BILATERAL TURBINATE REDUCTION (Bilateral) as a surgical intervention .  The patient's history has been re-reviewed, patient re-examined, no change in status, stable for surgery.  I have re-reviewed the patient's chart and labs.  Questions were answered to the patient's satisfaction.     Flo ShanksWOLICKI, [name]

## 2016-01-21 NOTE — Progress Notes (Signed)
Unable to scan Afrin nasal spray for second spray. 2 Sprays given to each nostril. CRNA notified.

## 2016-01-21 NOTE — Op Note (Signed)
01/21/2016  10:29 AM    Fuller Song  [id number]   Pre-Op Dx:  Deviated Nasal Septum, Hypertrophic Inferior Turbinates, obstructive sleep apnea Post-op Dx: Same  Proc: Nasal Septoplasty, Bilateral SMR Inferior Turbinates , obstructive sleep apnea  Surg:  Flo Shanks T MD  Anes:  GOT  EBL:  min  Comp:  none  Findings:  Severely corrugated nasal septum with caudal edge presenting into RIGHT nasal vestibule, prominent RIGHT maxillary crest spurring, and overall LEFTward septal deviation.  Bulky inferior turbinates, L>R.  Procedure: With the patient in a comfortable supine position,  general orotracheal anesthesia was induced without difficulty.     The patient received preoperative Afrin spray for topical decongestion and vasoconstriction.  Intravenous prophylactic antibiotics were administered.  At an appropriate level, the patient was placed in a semi-sitting position.  A saline moistened throat pack was placed.  Nasal vibrissae were trimmed.   Afrin  solution was applied on 0.5" x 3" cottonoids to both sides of the septal mucosa.   1% Xylocaine with 1:100,000 epinephrine, 10 cc's, was infiltrated into the anterior floor of the nose, into the nasal spine region, into the membranous columella, and finally into the submucoperichondrial plane of the septum on both sides.  Several minutes were allowed for this to take effect.  A sterile preparation and draping of the midface was accomplished in the standard fashion.  The materials were removed from the nose and observed to be intact and correct in number.  The nose was inspected with a headlight with the findings as described above.  A RIGHT hemitransfixion incision was sharply executed and carried down to the caudal edge of the quadrangular cartilage and continued to a floor incision.  An opposite small floor incision was sharply executed as well.   Floor tunnels were elevated on both sides, carried posteriorly, then medially, then  brought forward along the vomer and maxillary crest.  The submucoperichondrial plane of the  RIGHT septum was dissected up to the dorsum of the nose, back onto the perpendicular plate, and brought down and communicated with a floor tunnel and then forward along the maxillary crest.  The flap was generated intact.  The chondroethmoid junction was identified and opened with a Risk analyst.  The opposite submucoperiosteal plane of the perpendicular plate of the ethmoid  was elevated and carried down to the floor tunnel posteriorly.  The superior perpendicular plate was lysed with an open Jansen-Middleton forceps.  The inferior portion was dissected from the maxillary crest and vomer with a Cottle elevator.  The midportion was rocked free with a closed [employer] forceps and then delivered.    The posterior inferior corner of the quadrangular cartilage was submucosally resected, including a cartilaginous tail up along the vomer.    3 mm strip of cartilage along the maxillary crest was submucosally resected. Maxillary crest was reduced with a mallet and osteotome. The septum was separated from the upper lateral cartilages sharply on both sides. An intracrural pocket was generated sharply. A vertical incision was made in the anterior septum to allow it to flatten. This was controlled with 4-0 chromic sutures. The caudal septum was tucked into the intracrural pocket and secured there with a 4-0 PDS suture. The septum was secured to the nasal spine with another 4-0 PDS suture. At this point the septum was straight in the midline. A small posterior rent in the left flap was generated of no consequence.   A good straight midline configuration of the septum with  good dorsal support was observed.  The septal tunnel was suctioned clear.  Hemostasis was observed.  The flaps were laid back down.  The incisions were closed with interrupted 4-0 chromic suture.  Just prior to completing the septoplasty, the  inferior turbinates were each infiltrated with additional 1% Xylocaine with 1:100,000 epinephrine,  8 cc's total.  Upon completing the septoplasty, beginning on the RIGHT side, the inferior turbinate was inspected and infractured.  The anterior hood of the inferior turbinate was sharply lysed just behind the nasal valve.  The medial mucosa of the inferior turbinate was incised in an  anterior upsloping fashion and a laterally based flap was developed from the turbinate bone.  Using angled turbinate scissors, turbinate bone and lateral mucosa were resected in a posterior downsloping fashion, taking much of the anterior pole and leaving most of the posterior pole.  Bony spicules were submucosally dissected and removed.  The mucosal flap was laid back down and the turbinate was outfractured.  This completed one SMR inferior turbinate.  The opposite side was performed in identical fashion.  The cut mucosal edges were suction coagulated on both sides for hemostasis.  Again hemostasis was observed.  After completing both turbinate resections, 0.040" reinforced Silastic splints were fashioned, placed against the nasal septum for support, and secured thereto with a 3-0 Ethilon stitch.   Telfa packs impregnated with bacitracin ointment were placed between the septum and the inferior turbinates, one on each side, for hemostasis and support.  A 6.5 mm nasal trumpet was shortened and placed between the Telfa and the Silastic in both sides to allow some airway.  Hemostasis was observed.  At this point the procedure was completed.  The pharynx was suctioned free and the throat pack was removed.   The patient was returned to anesthesia, awakened, extubated, and transferred to recovery in stable condition.  Dispo:   PACU to overnight stepdown observation given possible OSA.   Plan: Ice, elevation, narcotic analgesia, prophylactic antibiotics for the duration of indwelling nasal foreign bodies.  We will remove the nasal  packing In one day, the septal splints in 9 days.  Return to work or school in 10 days, strenuous activities in two weeks.  Cephus RicherWOLICKI,  Polette Nofsinger T MD

## 2016-01-21 NOTE — Transfer of Care (Signed)
Immediate Anesthesia Transfer of Care Note  Patient: Brett SongCharlie Berenson  Procedure(s) Performed: Procedure(s): NASAL SEPTOPLASTY WITH BILATERAL TURBINATE REDUCTION (Bilateral)  Patient Location: PACU  Anesthesia Type:General  Level of Consciousness: awake, alert  and oriented  Airway & Oxygen Therapy: Patient Spontanous Breathing and Patient connected to face mask oxygen  Post-op Assessment: Report given to RN, Post -op Vital signs reviewed and stable and Patient moving all extremities  Post vital signs: Reviewed and stable  Last Vitals:  Vitals:   01/21/16 0740 01/21/16 0745  BP: 131/87   Pulse: 65   Resp: 18   Temp:  37.1 C    Last Pain: There were no vitals filed for this visit.       Complications: No apparent anesthesia complications

## 2016-01-22 ENCOUNTER — Encounter (HOSPITAL_COMMUNITY): Payer: Self-pay | Admitting: Otolaryngology

## 2016-01-22 DIAGNOSIS — J342 Deviated nasal septum: Secondary | ICD-10-CM | POA: Diagnosis not present

## 2016-01-22 NOTE — Progress Notes (Signed)
Discharge instructions given to patient, all questions answered at this time.  Pt. VSS with no s/s of distress noted.  Patient stable at discharge.   

## 2016-01-22 NOTE — Discharge Summary (Signed)
01/22/2016 10:26 AM  Brett Ball, Brett Ball [id number]  Post-Op Day 1    Temp:  [97.3 F (36.3 C)-98.6 F (37 C)] 97.9 F (36.6 C) (09/21 0700) Pulse Rate:  [68-94] 86 (09/21 0700) Resp:  [11-20] 20 (09/21 0700) BP: (113-168)/(75-110) 126/76 (09/21 0700) SpO2:  [97 %-100 %] 99 % (09/21 0700) Weight:  [97.6 kg (215 lb 2.7 oz)] 97.6 kg (215 lb 2.7 oz) (09/20 1339),     Intake/Output Summary (Last 24 hours) at 01/22/16 1026 Last data filed at 01/22/16 0605  Gross per 24 hour  Intake          2498.75 ml  Output              600 ml  Net          1898.75 ml    Results for orders placed or performed during the hospital encounter of 01/21/16 (from the past 24 hour(s))  Glucose, capillary     Status: Abnormal   Collection Time: 01/21/16 10:32 AM  Result Value Ref Range   Glucose-Capillary 142 (H) 65 - 99 mg/dL   Comment 1 Notify RN    Comment 2 Document in Chart     SUBJECTIVE:  Min pain. No breathing difficulty.  Min bleeding.  Eating/drinking  OBJECTIVE:  Color/energy good.  Voice clear. Breathing easily.  Nasal trumpets and Telfa packs removed. Min bleeding.  IMPRESSION:  Satisfactory check  PLAN:  Discharge to home and care of family.  Elevation, nasal hygiene measures.  Removal of septal splints 8 days.  Admit:  20 SEP Discharge:  21 SEP Final Diagnosis:  Nasal septal deviation.  Hypertrophic inferior turbinates.  Probably obstructive sleep apnea Proc: nasal septoplasty, bilateral SMR inferior turbinates 20 SEP Comp:  None Cond: ambulatory, pain controlled. Breathing well Rx's:  Hydrocodone, Cephalexin Recheck: 8 days for septal splint removal Instructions written and given  Hosp Course:  Underwent successful surgery.  Min pain.  Eating and drinking full regular diet.  No O2 issues.  Packing removed AM of POD 2. Discharged to home and care of family  Flo ShanksWOLICKI, Brett Ball

## 2016-01-22 NOTE — Anesthesia Postprocedure Evaluation (Signed)
Anesthesia Post Note  Patient: Brett Ball  Procedure(s) Performed: Procedure(s) (LRB): NASAL SEPTOPLASTY WITH BILATERAL TURBINATE REDUCTION (Bilateral)  Patient location during evaluation: PACU Anesthesia Type: General Level of consciousness: awake and alert Pain management: pain level controlled Vital Signs Assessment: post-procedure vital signs reviewed and stable Respiratory status: spontaneous breathing, nonlabored ventilation, respiratory function stable and patient connected to face mask oxygen Cardiovascular status: blood pressure returned to baseline and stable Postop Assessment: no signs of nausea or vomiting Anesthetic complications: no    Last Vitals:  Vitals:   01/22/16 0225 01/22/16 0700  BP: 121/78 126/76  Pulse: 78 86  Resp: 20 20  Temp:  36.6 C    Last Pain:  Vitals:   01/22/16 0745  TempSrc:   PainSc: 2                  Cyree Chuong A

## 2016-01-22 NOTE — Care Management Note (Signed)
Case Management Note  Patient Details  Name: Brett Ball MRN: [id number] Date of Birth: Feb 26, 1961  Subjective/Objective:    Deviated Nasal Septum, s/p  Hypertrophic Inferior Turbinates,  pta indep, lives with wife. He is for dc today.  He has no problems getting medications.  No needs.                Action/Plan:   Expected Discharge Date:                  Expected Discharge Plan:  Home/Self Care  In-House Referral:     Discharge planning Services  CM Consult  Post Acute Care Choice:    Choice offered to:     DME Arranged:    DME Agency:     HH Arranged:    HH Agency:     Status of Service:  Completed, signed off  If discussed at [employer] Length of Stay Meetings, dates discussed:    Additional Comments:  Leone Havenaylor, Cordai Rodrigue Clinton, RN 01/22/2016, 12:17 PM

## 2017-11-11 IMAGING — US US ABDOMEN COMPLETE
1 series · 14 of 25 positions shown · non-contrast
Comparison: None.

CLINICAL DATA: Two week history of left abdominal and flank pain

EXAM:
ABDOMEN ULTRASOUND COMPLETE

[Series 1: us abdomen complete · 0.19mm/px · 14 of 117 slices shown]
[im 1/117]
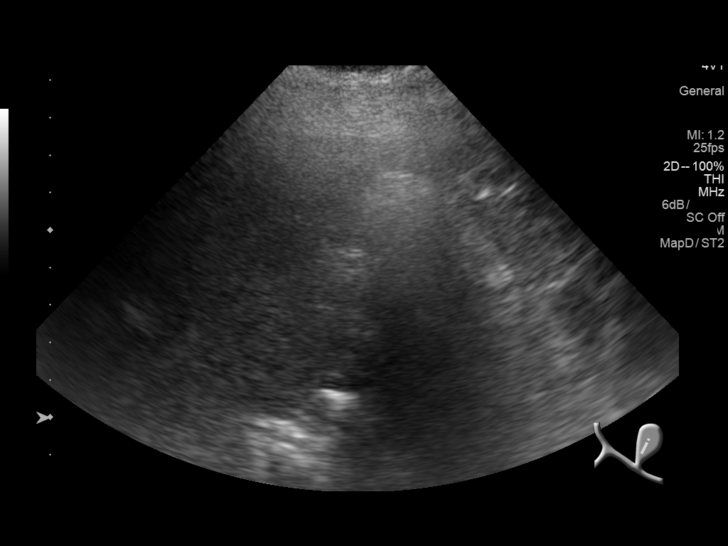
[im 10/117]
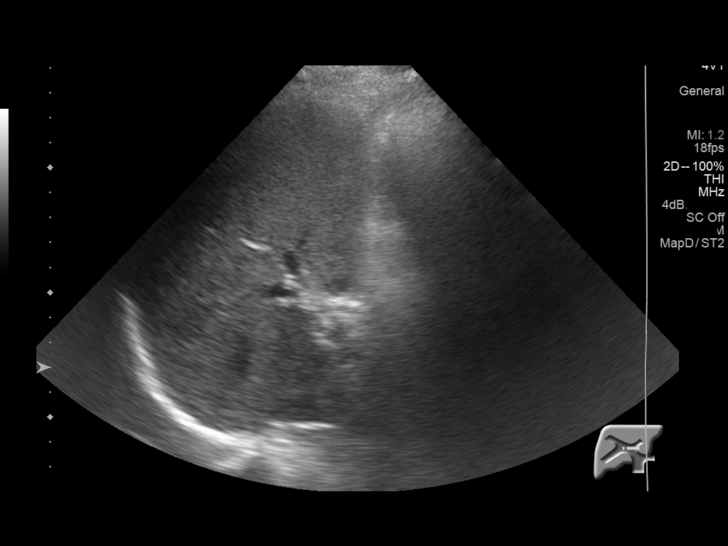
[im 20/117]
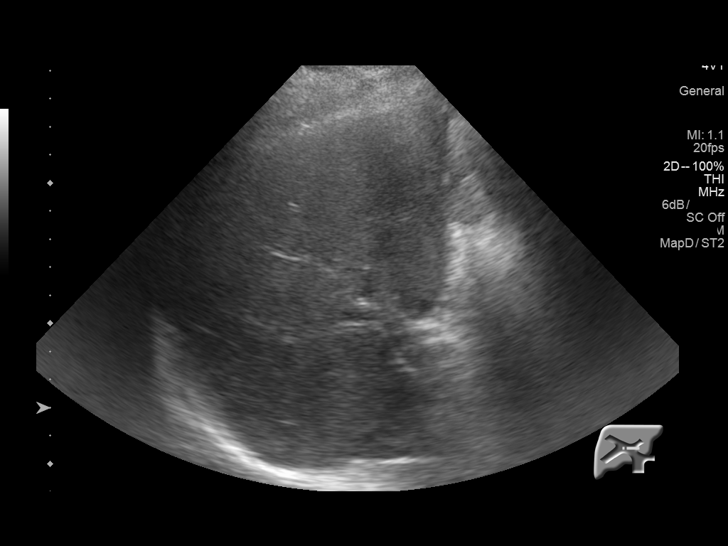
[im 30/117]
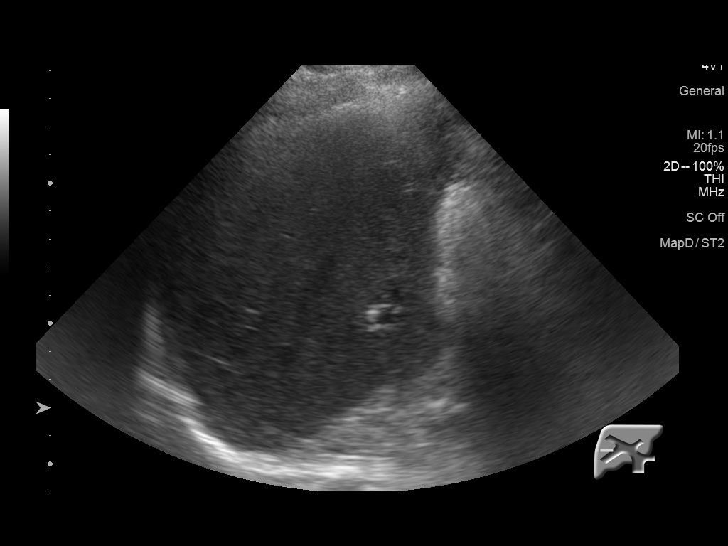
[im 39/117]
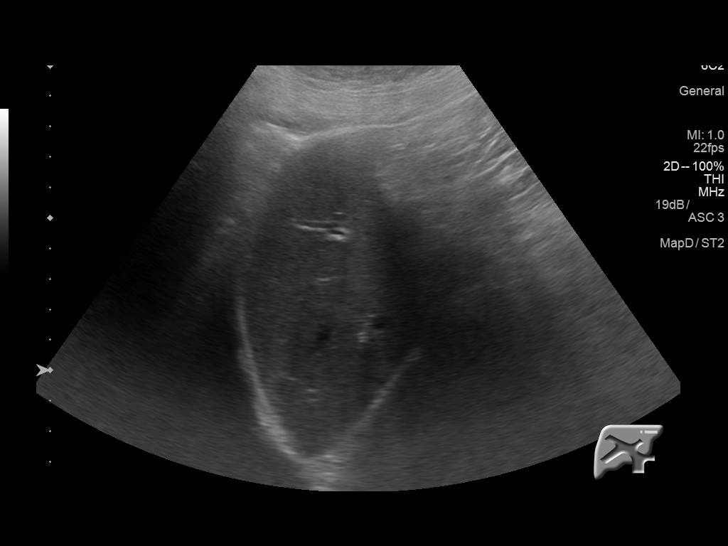
[im 44/117]
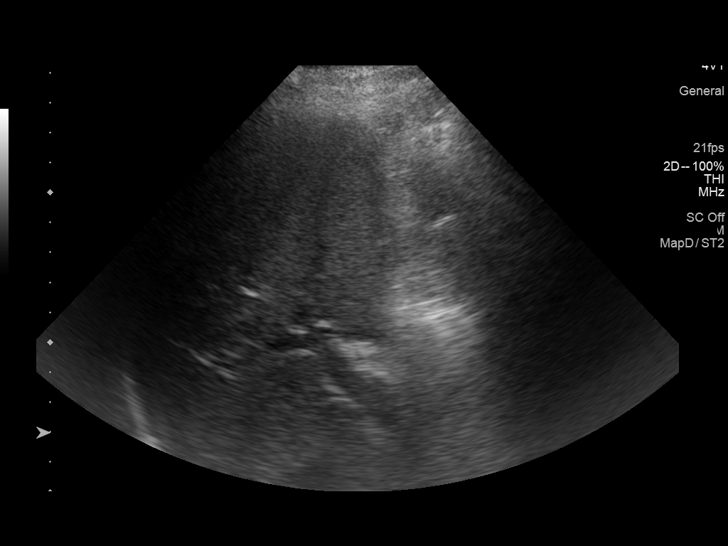
[im 54/117]
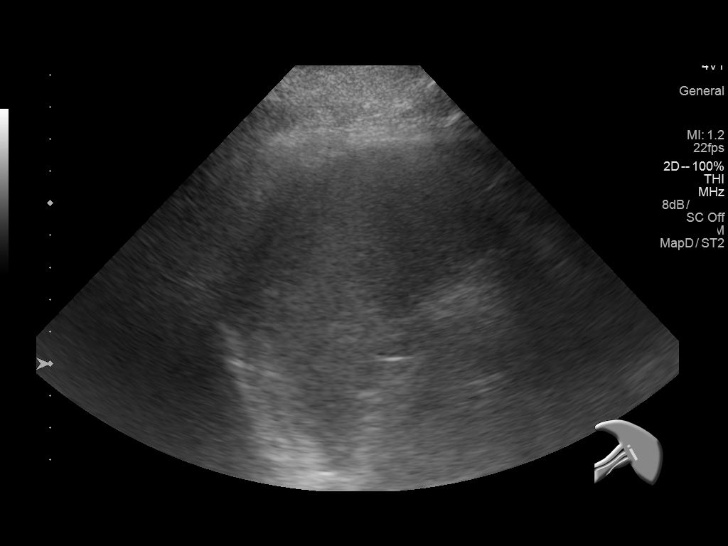
[im 63/117]
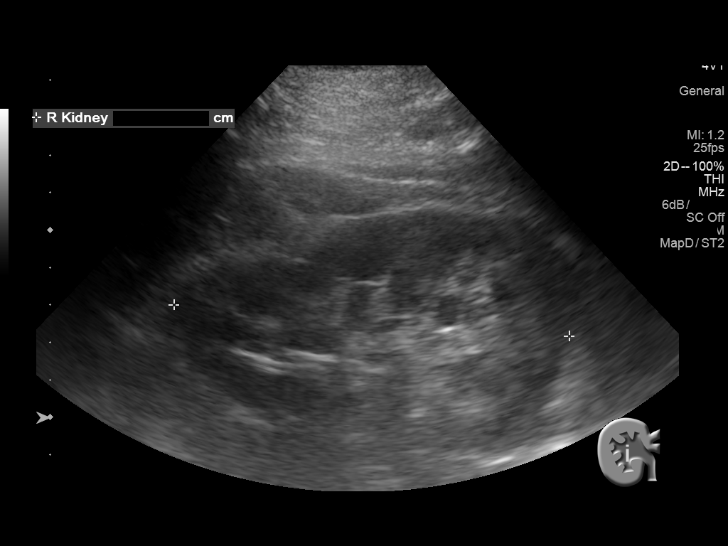
[im 73/117]
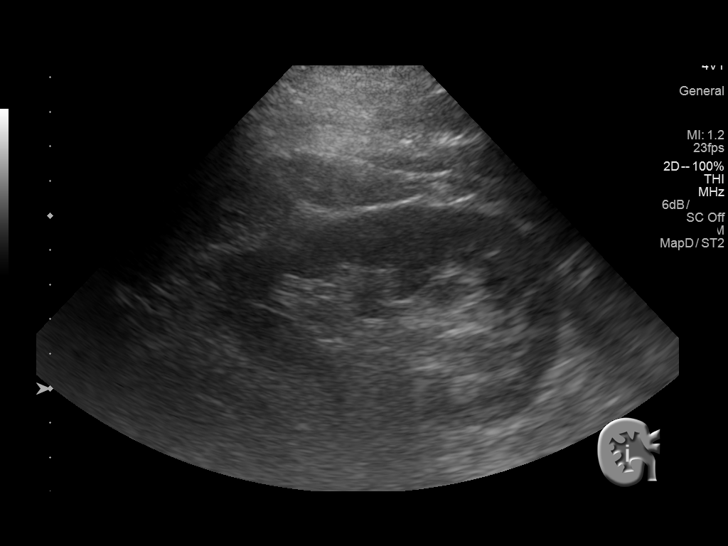
[im 78/117]
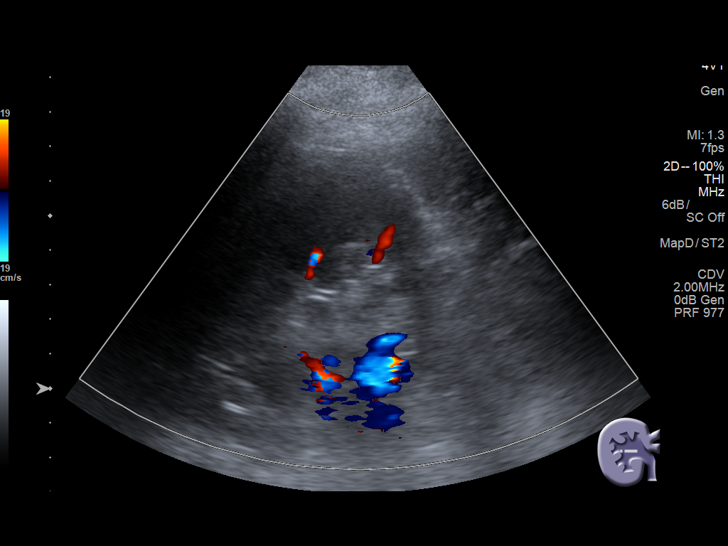
[im 88/117]
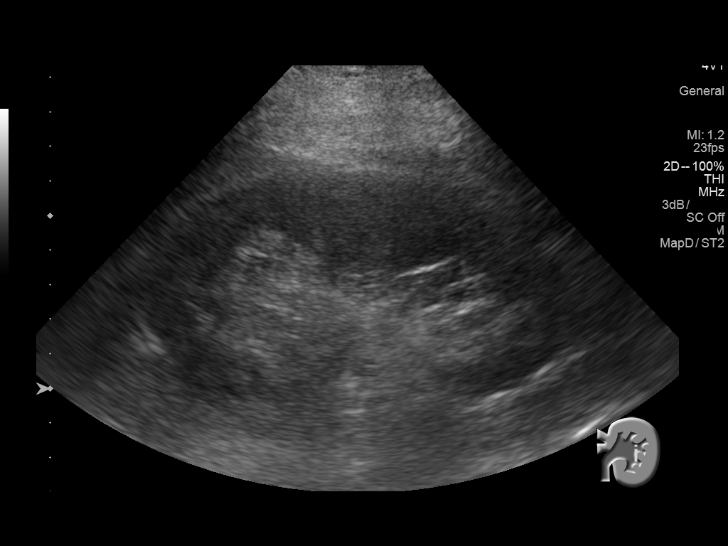
[im 97/117]
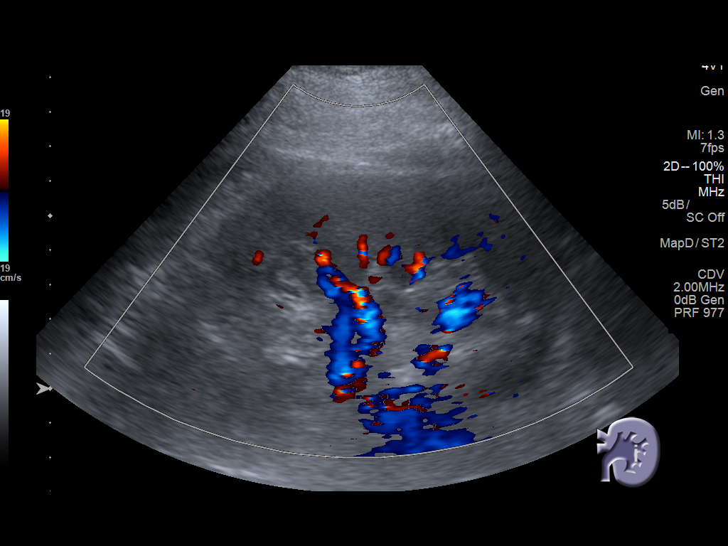
[im 107/117]
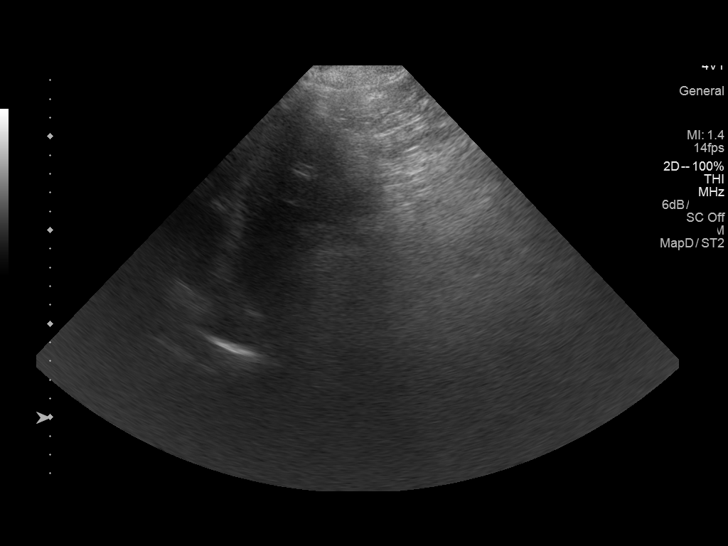
[im 117/117]
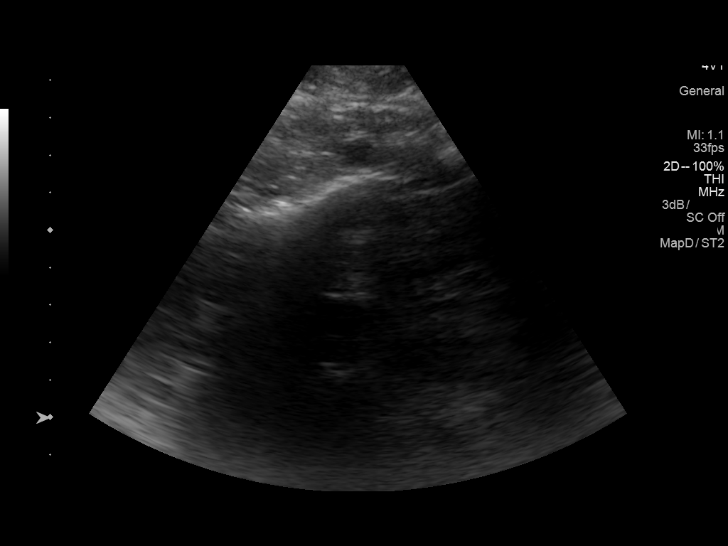

[14 of 25 positions shown; findings below may reference images not displayed]

FINDINGS: Gallbladder: Surgically absent.

Common bile duct: Diameter: 4 mm. There is no demonstrable
intrahepatic, common hepatic, or common bile duct dilatation.

Liver: No focal lesion identified. Within normal limits in
parenchymal echogenicity.

IVC: No abnormality visualized.

Pancreas: Pancreas is nearly completely obscured by gas.

Spleen: Size and appearance within normal limits.

Right Kidney: Length: 10.6 cm. Echogenicity within normal limits. No
mass or hydronephrosis visualized.

Left Kidney: Length: 13.0 cm. Echogenicity within normal limits. No
mass or hydronephrosis visualized.

Abdominal aorta: No aneurysm visualized.

Other findings: No demonstrable ascites.
IMPRESSION: Gallbladder absent. Pancreas essentially completely obscured by gas.

Left kidney is larger than right kidney. Significance of this
finding is uncertain. This finding potentially may be indicative of
renal artery stenosis on the right. By report, patient is
hypertensive.

Study otherwise unremarkable.

## 2022-02-23 ENCOUNTER — Encounter (INDEPENDENT_AMBULATORY_CARE_PROVIDER_SITE_OTHER): Payer: Self-pay | Admitting: *Deleted

## 2024-02-24 ENCOUNTER — Encounter (INDEPENDENT_AMBULATORY_CARE_PROVIDER_SITE_OTHER): Payer: Self-pay | Admitting: *Deleted
